# Patient Record
Sex: Female | Born: 2000 | Hispanic: No | Marital: Single | Smoking: Never smoker
Health system: Southern US, Community
[De-identification: ages and names within clinical notes are randomized; demographics above are authoritative.]

## PROBLEM LIST (undated history)

## (undated) DIAGNOSIS — R519 Headache, unspecified: Secondary | ICD-10-CM

## (undated) DIAGNOSIS — F329 Major depressive disorder, single episode, unspecified: Secondary | ICD-10-CM

## (undated) DIAGNOSIS — F32A Depression, unspecified: Secondary | ICD-10-CM

## (undated) DIAGNOSIS — R51 Headache: Secondary | ICD-10-CM

## (undated) DIAGNOSIS — F419 Anxiety disorder, unspecified: Secondary | ICD-10-CM

## (undated) DIAGNOSIS — F514 Sleep terrors [night terrors]: Secondary | ICD-10-CM

## (undated) HISTORY — PX: WISDOM TOOTH EXTRACTION: SHX21

## (undated) HISTORY — DX: Headache: R51

## (undated) HISTORY — DX: Headache, unspecified: R51.9

---
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
## 2020-10-05 ENCOUNTER — Other Ambulatory Visit: Payer: Self-pay

## 2020-10-05 ENCOUNTER — Encounter (HOSPITAL_COMMUNITY): Payer: Self-pay | Admitting: *Deleted

## 2020-10-05 ENCOUNTER — Emergency Department (HOSPITAL_COMMUNITY)
Admission: EM | Admit: 2020-10-05 | Discharge: 2020-10-06 | Disposition: A | Discharge status: Home / Self Care | Payer: Medicaid - Out of State | Attending: Emergency Medicine | Admitting: Emergency Medicine

## 2020-10-05 DIAGNOSIS — F1290 Cannabis use, unspecified, uncomplicated: Secondary | ICD-10-CM | POA: Insufficient documentation

## 2020-10-05 DIAGNOSIS — B9689 Other specified bacterial agents as the cause of diseases classified elsewhere: Secondary | ICD-10-CM | POA: Insufficient documentation

## 2020-10-05 DIAGNOSIS — O26891 Other specified pregnancy related conditions, first trimester: Secondary | ICD-10-CM | POA: Insufficient documentation

## 2020-10-05 DIAGNOSIS — R102 Pelvic and perineal pain: Secondary | ICD-10-CM | POA: Diagnosis not present

## 2020-10-05 DIAGNOSIS — O99321 Drug use complicating pregnancy, first trimester: Secondary | ICD-10-CM | POA: Diagnosis not present

## 2020-10-05 DIAGNOSIS — B3731 Acute candidiasis of vulva and vagina: Secondary | ICD-10-CM | POA: Insufficient documentation

## 2020-10-05 DIAGNOSIS — Z3A11 11 weeks gestation of pregnancy: Secondary | ICD-10-CM | POA: Diagnosis not present

## 2020-10-05 DIAGNOSIS — O98811 Other maternal infectious and parasitic diseases complicating pregnancy, first trimester: Secondary | ICD-10-CM | POA: Insufficient documentation

## 2020-10-05 DIAGNOSIS — Z880 Allergy status to penicillin: Secondary | ICD-10-CM | POA: Insufficient documentation

## 2020-10-05 DIAGNOSIS — N760 Acute vaginitis: Secondary | ICD-10-CM | POA: Diagnosis not present

## 2020-10-05 DIAGNOSIS — O99891 Other specified diseases and conditions complicating pregnancy: Secondary | ICD-10-CM | POA: Diagnosis not present

## 2020-10-05 NOTE — ED Notes (Signed)
POC urine pregnancy test was Positive

## 2020-10-05 NOTE — ED Triage Notes (Signed)
Pt c/o lower abdominal pain and back aches x 4 days. Pt is currently [redacted] weeks pregnant. Pt has been seeing an OB in [place], [place] and just moved here. She reports she has had a normal pregnancy up until now. Denies vaginal bleeding.

## 2020-10-05 NOTE — ED Provider Notes (Signed)
[facility] EMERGENCY DEPARTMENT Provider Note   CSN: [id number] Arrival date & time: 10/05/20  1759     History Chief Complaint  Patient presents with   Abdominal Pain    Danielle [name] is a 20 y.o. female.  HPI She complains of pelvic cramping and discharge for several days.  She is [redacted] weeks pregnant.  She had a pelvic ultrasound done, at 8 weeks pregnancy, 3 weeks ago that was normal by her report.  This was done in [place], prior to her moving here.  She has now relocated.  She denies fever, chills, nausea, vomiting, dysuria or urinary frequency.  This is her first pregnancy.  There are no other known active modifying factors    Past Medical History:  Diagnosis Date   Anxiety    Depression    Headache    Night terrors, childhood     Patient Active Problem List   Diagnosis Date Noted   Loss of weight 04/27/2017   Episodic tension type headache 03/03/2017   Anxiety 01/30/2017   Migraine without aura, not intractable, without status migrainosus 01/25/2017    Past Surgical History:  Procedure Laterality Date   WISDOM TOOTH EXTRACTION       OB History     Gravida  1   Para      Term      Preterm      AB      Living         SAB      IAB      Ectopic      Multiple      Live Births              No family history on file.  Social History   Tobacco Use   Smoking status: Never   Smokeless tobacco: Never  Vaping Use   Vaping Use: Never used  Substance Use Topics   Alcohol use: No   Drug use: No    Home Medications Prior to Admission medications   Medication Sig Start Date End Date Taking? Authorizing Provider  cloNIDine (CATAPRES) 0.1 MG tablet Take 0.1 mg by mouth at bedtime.    [provider]  escitalopram (LEXAPRO) 10 MG tablet Take 10 mg by mouth daily.    [provider]  etonogestrel (IMPLANON) 68 MG IMPL implant 1 each by Subdermal route once.    [provider]  fexofenadine (ALLEGRA ALLERGY) 180 MG  tablet Allegra Allergy 180 mg tablet  Take 1 tablet every day by oral route.    [provider]  fluticasone (FLONASE ALLERGY RELIEF) 50 MCG/ACT nasal spray Flonase Allergy Relief 50 mcg/actuation nasal spray,suspension  Spray 1 spray every day by intranasal route.    [provider]  ibuprofen (ADVIL,MOTRIN) 200 MG tablet Take 200 mg by mouth every 6 (six) hours as needed for moderate pain.    [provider]  mirtazapine (REMERON) 15 MG tablet Take 15 mg by mouth at bedtime.    [provider]  mirtazapine (REMERON) 30 MG tablet  04/12/17   [provider]  ondansetron (ZOFRAN-ODT) 4 MG disintegrating tablet ondansetron 4 mg disintegrating tablet  Place 1 tablet every 6 hours by translingual route as needed.    [provider]  SUMAtriptan (IMITREX) 25 MG tablet Take 1 tablet at onset of migraine along with Ibuprofen 400mg . May repeat in 2 hours if needed. 03/02/17   03/04/17, NP  tiZANidine (ZANAFLEX) 2 MG tablet Take 1 tablet  for severe pain. May repeat in 8 hours if needed. Patient taking differently: Take 2 mg by mouth every 8 (eight) hours as needed. Take 1 tablet for severe pain. May repeat in 8 hours if needed. 01/25/17   Elveria Rising, NP  Topiramate ER (TROKENDI XR) 25 MG CP24 Take 3 capsules at bedtime 04/27/17   Elveria Rising, NP  TRAZODONE HCL PO Take by mouth.    [provider]  Vitamin D-Vitamin K (VITAMIN K2-VITAMIN D3) 45-2000 MCG-UNIT CAPS vitamin D3 1,250 mcg (50,000 unit)-vitamin K2 200 mcg capsule  Take 1 capsule every week by oral route.    [provider]    Allergies    Penicillins and Prazosin  Review of Systems   Review of Systems  All other systems reviewed and are negative.  Physical Exam Updated Vital Signs BP (!) 90/55   Pulse 81   Temp 98.1 F (36.7 C) (Oral)   Resp 19   Ht 5\' 1"  (1.549 m)   Wt 49.9 kg   SpO2 100%   BMI 20.78 kg/m   Physical Exam Vitals and  nursing note reviewed.  Constitutional:      General: She is not in acute distress.    Appearance: She is well-developed. She is not ill-appearing or toxic-appearing.  HENT:     Head: Normocephalic and atraumatic.     Right Ear: External ear normal.     Left Ear: External ear normal.  Eyes:     Conjunctiva/sclera: Conjunctivae normal.     Pupils: Pupils are equal, round, and reactive to light.  Neck:     Trachea: Phonation normal.  Cardiovascular:     Rate and Rhythm: Normal rate.  Pulmonary:     Effort: Pulmonary effort is normal.  Abdominal:     General: There is no distension.     Palpations: Abdomen is soft.     Tenderness: There is no abdominal tenderness.  Musculoskeletal:        General: Normal range of motion.     Cervical back: Normal range of motion and neck supple.  Skin:    General: Skin is warm and dry.  Neurological:     Mental Status: She is alert and oriented to person, place, and time.     Cranial Nerves: No cranial nerve deficit.     Sensory: No sensory deficit.     Motor: No abnormal muscle tone.     Coordination: Coordination normal.  Psychiatric:        Mood and Affect: Mood normal.        Behavior: Behavior normal.        Thought Content: Thought content normal.        Judgment: Judgment normal.    ED Results / Procedures / Treatments   Labs (all labs ordered are listed, but only abnormal results are displayed) Labs Reviewed  URINALYSIS, ROUTINE W REFLEX MICROSCOPIC - Abnormal; Notable for the following components:      Result Value   APPearance CLOUDY (*)    Leukocytes,Ua LARGE (*)    Bacteria, UA RARE (*)    All other components within normal limits  WET PREP, GENITAL  POC URINE PREG, ED  GC/CHLAMYDIA PROBE AMP ([place]) NOT AT [facility]    EKG None  Radiology No results found.  Procedures Procedures   Medications Ordered in ED Medications - No data to display  ED Course  I have reviewed the triage vital signs and the nursing  notes.  Pertinent labs &  imaging results that were available during my care of the patient were reviewed by me and considered in my medical decision making (see chart for details).  Clinical Course as of 10/06/20 0134  Tue Oct 06, 2020  0024 The patient in conjunction with her mother, declined pelvic examination and blood testing.  She wanted [place] to try to loosen the baby's heartbeat which I did and was unable to obtain a fetal pulse with the Doppler.  This is not unusual at 11 weeks.  Patient and her mother understand that further testing cannot be done at this facility, at this time. [EW]    Clinical Course User Index [EW] Mancel Bale, MD   MDM Rules/Calculators/A&P                            Patient Vitals for the past 24 hrs:  BP Temp Temp src Pulse Resp SpO2 Height Weight  10/06/20 0100 (!) 90/55 -- -- 81 19 100 % -- --  10/06/20 0030 93/60 -- -- 71 19 100 % -- --  10/06/20 0000 99/62 -- -- 88 -- 99 % -- --  10/05/20 2330 93/63 -- -- 80 -- 100 % -- --  10/05/20 2300 93/60 -- -- 79 18 99 % -- --  10/05/20 2230 108/62 -- -- (!) 103 16 100 % -- --  10/05/20 2200 100/68 -- -- 87 18 100 % -- --  10/05/20 1811 -- -- -- -- -- -- 5\' 1"  (1.549 m) 49.9 kg  10/05/20 1809 115/65 98.1 F (36.7 C) Oral 98 17 99 % -- --    1:34 AM Reevaluation with update and discussion. After initial assessment and treatment, an updated evaluation reveals no change in clinical status, findings cussed with patient, mother, questions answered. 12/05/20   Medical Decision Making:  This patient is presenting for evaluation of cramping during pregnancy, which does require a range of treatment options, and is a complaint that involves a moderate risk of morbidity and mortality. The differential diagnoses include pelvic infection, pregnancy complication, obstetric care. I decided to review old records, and in summary 20 year old female presenting with symptoms during pregnancy, having had a prior  intrauterine pregnancy, 3 weeks ago.  She has not had current medical obstetric care.  I obtained additional historical information from mother at bedside.  Clinical Laboratory Tests Ordered, included Urinalysis and wet prep, GC, chlamydia, RPR, HIV . Review indicates urinalysis does not indicate infection.  Patient refused other testing    Critical Interventions-clinical evaluation, laboratory testing.  Attempted fetal heart tone assessment, unsuccessful.  Patient and mother decided not to get pelvic exam with testing to help determine source of infection.  Unable to proceed further with assessment, and no indication for transfer to higher level of care at this time.  Low suspicion for preterm pregnancy or acute pregnancy complication.  No indication for ectopic pregnancy.  Doubt sepsis or PID.  After These Interventions, the Patient was reevaluated and was found stable for discharge.  She does not have urine symptoms and urinalysis was not definitive for UTI therefore will follow expectantly.  She has access to care in the community, and plans to do that.  CRITICAL CARE-no Performed by: 26  Nursing Notes Reviewed/ Care Coordinated Applicable Imaging Reviewed Interpretation of Laboratory Data incorporated into ED treatment  The patient appears reasonably screened and/or stabilized for discharge and I doubt any other medical condition or other [facility] requiring further screening, evaluation,  or treatment in the ED at this time prior to discharge.  Plan: Home Medications-routine prenatal care medications; Home Treatments-pelvic rest, fluids, regular diet; return here if the recommended treatment, does not improve the symptoms; Recommended follow up-obstetrics as soon as possible and as needed.  Return here for problems.     Final Clinical Impression(s) / ED Diagnoses Final diagnoses:  [redacted] weeks gestation of pregnancy    Rx / DC Orders ED Discharge Orders     None         Mancel Bale, MD 10/06/20 [phone]

## 2020-10-06 ENCOUNTER — Other Ambulatory Visit: Payer: Self-pay

## 2020-10-06 ENCOUNTER — Inpatient Hospital Stay (EMERGENCY_DEPARTMENT_HOSPITAL)
Admission: AD | Admit: 2020-10-06 | Discharge: 2020-10-06 | Disposition: A | Discharge status: Home / Self Care | Payer: Medicaid - Out of State | Source: Home / Self Care | Attending: Obstetrics and Gynecology | Admitting: Obstetrics and Gynecology

## 2020-10-06 ENCOUNTER — Inpatient Hospital Stay (HOSPITAL_COMMUNITY): Payer: Medicaid - Out of State

## 2020-10-06 ENCOUNTER — Encounter (HOSPITAL_COMMUNITY): Payer: Self-pay | Admitting: Obstetrics and Gynecology

## 2020-10-06 DIAGNOSIS — B3731 Acute candidiasis of vulva and vagina: Secondary | ICD-10-CM

## 2020-10-06 DIAGNOSIS — Z3490 Encounter for supervision of normal pregnancy, unspecified, unspecified trimester: Secondary | ICD-10-CM

## 2020-10-06 DIAGNOSIS — F1290 Cannabis use, unspecified, uncomplicated: Secondary | ICD-10-CM | POA: Insufficient documentation

## 2020-10-06 DIAGNOSIS — Z3A11 11 weeks gestation of pregnancy: Secondary | ICD-10-CM | POA: Insufficient documentation

## 2020-10-06 DIAGNOSIS — O98811 Other maternal infectious and parasitic diseases complicating pregnancy, first trimester: Secondary | ICD-10-CM | POA: Insufficient documentation

## 2020-10-06 DIAGNOSIS — Z880 Allergy status to penicillin: Secondary | ICD-10-CM | POA: Insufficient documentation

## 2020-10-06 DIAGNOSIS — O99891 Other specified diseases and conditions complicating pregnancy: Secondary | ICD-10-CM

## 2020-10-06 DIAGNOSIS — N760 Acute vaginitis: Secondary | ICD-10-CM | POA: Diagnosis not present

## 2020-10-06 DIAGNOSIS — B9689 Other specified bacterial agents as the cause of diseases classified elsewhere: Secondary | ICD-10-CM

## 2020-10-06 DIAGNOSIS — O99321 Drug use complicating pregnancy, first trimester: Secondary | ICD-10-CM | POA: Insufficient documentation

## 2020-10-06 DIAGNOSIS — R109 Unspecified abdominal pain: Secondary | ICD-10-CM | POA: Insufficient documentation

## 2020-10-06 DIAGNOSIS — R102 Pelvic and perineal pain: Secondary | ICD-10-CM

## 2020-10-06 LAB — WET PREP, GENITAL
Sperm: NONE SEEN
Trich, Wet Prep: NONE SEEN

## 2020-10-06 LAB — URINALYSIS, ROUTINE W REFLEX MICROSCOPIC
Bacteria, UA: NONE SEEN
Bilirubin Urine: NEGATIVE
Bilirubin Urine: NEGATIVE
Glucose, UA: NEGATIVE mg/dL
Glucose, UA: NEGATIVE mg/dL
Hgb urine dipstick: NEGATIVE
Hgb urine dipstick: NEGATIVE
Ketones, ur: NEGATIVE mg/dL
Ketones, ur: NEGATIVE mg/dL
Nitrite: NEGATIVE
Nitrite: NEGATIVE
Protein, ur: NEGATIVE mg/dL
Protein, ur: NEGATIVE mg/dL
Specific Gravity, Urine: 1.019 (ref 1.005–1.030)
Specific Gravity, Urine: 1.021 (ref 1.005–1.030)
pH: 6 (ref 5.0–8.0)
pH: 6 (ref 5.0–8.0)

## 2020-10-06 MED ORDER — TERCONAZOLE 0.8 % VA CREA: 1.0000 | TOPICAL_CREAM | Freq: Every day | VAGINAL | 0 refills | Status: DC

## 2020-10-06 MED ORDER — METRONIDAZOLE 500 MG PO TABS: 500.0000 mg | ORAL_TABLET | Freq: Two times a day (BID) | ORAL | 0 refills | Status: DC

## 2020-10-06 NOTE — MAU Note (Addendum)
Presents with c/o cramping x4 days.  Reports cramping used to occur only in the morning and evening, now occurring all day.  Denies VB, but has vaginal discharge.  Reports discharge is thick, white/yellow and has no odor.  States she doesn't have any infections because she was tested @ [facility] last night. Received PNC in [place], none since moving to [place].

## 2020-10-06 NOTE — Discharge Instructions (Addendum)
The testing today did not indicate serious problems.  There was no evidence for urinary tract infection to complicate the pregnancy.  Follow-up with a obstetrician as soon as possible for further care and treatment.  Return to the emergency department of your choice as needed for problems.

## 2020-10-06 NOTE — Discharge Instructions (Signed)
Prenatal Care Providers           [facility]  [address] [phone]  [facility]   [address]  [phone]  [facility] [place]       [address] [phone]            [facility] [place]     [phone]          [facility] [place]   [address] [phone]  [facility]  [address] [phone]     [facility]  [address]   [phone]     [facility]  Phone: [phone]  [facility]  Phone: [phone]  [facility] Phone: [phone]  [facility] Phone: [phone]  [facility] Phone: [phone]  [facility] Phone: [phone]  [facility]  Phone: [phone] Safe Medications in Pregnancy   Acne: Benzoyl Peroxide Salicylic Acid  Backache/Headache: Tylenol: 2 regular strength every 4 hours OR              2 Extra strength every 6 hours  Colds/Coughs/Allergies: Benadryl (alcohol free) 25 mg every 6 hours as needed Breath right strips Claritin Cepacol throat lozenges Chloraseptic throat spray Cold-Eeze- up to three times per day Cough drops, alcohol free Flonase (by prescription only) Guaifenesin Mucinex Robitussin DM (plain only, alcohol free) Saline nasal spray/drops Sudafed (pseudoephedrine) & Actifed ** use only after [redacted] weeks gestation and if you do not have high blood pressure Tylenol Vicks Vaporub Zinc lozenges Zyrtec   Constipation: Colace Ducolax suppositories Fleet enema Glycerin suppositories Metamucil Milk of magnesia Miralax Senokot Smooth move tea  Diarrhea: Kaopectate Imodium A-D  *NO pepto  Bismol  Hemorrhoids: Anusol Anusol HC Preparation H Tucks  Indigestion: Tums Maalox Mylanta Zantac  Pepcid  Insomnia: Benadryl (alcohol free) 25mg every 6 hours as needed Tylenol PM Unisom, no Gelcaps  Leg Cramps: Tums MagGel  Nausea/Vomiting:  Bonine Dramamine Emetrol Ginger extract Sea bands Meclizine  Nausea medication to take during pregnancy:  Unisom (doxylamine succinate 25 mg tablets) Take one tablet daily at bedtime. If symptoms are not adequately controlled, the dose can be increased to a maximum recommended dose of two tablets daily (1/2 tablet in the morning, 1/2 tablet mid-afternoon and one at bedtime). Vitamin B6 100mg tablets. Take one tablet twice a day (up to 200 mg per day).  Skin Rashes: Aveeno products Benadryl cream or 25mg every 6 hours as needed Calamine Lotion 1% cortisone cream  Yeast infection: Gyne-lotrimin 7 Monistat 7   **If taking multiple medications, please check labels to avoid duplicating the same active ingredients **take medication as directed on the label ** Do not exceed 4000 mg of tylenol in 24 hours **Do not take medications that contain aspirin or ibuprofen    

## 2020-10-06 NOTE — ED Notes (Signed)
Pt states she does not feel like she needs any blood work or pelvic exam done, Dr. Effie Shy notified. Attempted to doppler FHTs by myself and Dr. Effie Shy, unable to do so due to [redacted]wk gestation.

## 2020-10-06 NOTE — MAU Provider Note (Signed)
History     CSN: [id number]  Arrival date and time: 10/06/20 1143   Event Date/Time   First Provider Initiated Contact with Patient 10/06/20 1217      Chief Complaint  Patient presents with   Cramping   HPI Danielle [name] is a 20 y.o. G1P0 at [redacted]w[redacted]d who presents to MAU with chief complaint of suprapubic cramping. This is a new problem, onset about five days ago. Pain score is 6/10. Pain does not radiate. She denies aggravating or alleviating factors. She has not taken medication or tried other treatments for this complaint. Patient is s/p evaluation for this complaint at [facility] ED last night. She states she was discharged home without a diagnosis.   Patient acknowledges THC use early in pregnancy. She denies use since returning to [place] from [place] last week.  OB History     Gravida  1   Para      Term      Preterm      AB      Living         SAB      IAB      Ectopic      Multiple      Live Births              Past Medical History:  Diagnosis Date   Anxiety    Depression    Headache    Night terrors, childhood     Past Surgical History:  Procedure Laterality Date   WISDOM TOOTH EXTRACTION      No family history on file.  Social History   Tobacco Use   Smoking status: Never   Smokeless tobacco: Never  Vaping Use   Vaping Use: Never used  Substance Use Topics   Alcohol use: No   Drug use: No    Allergies:  Allergies  Allergen Reactions   Penicillins Other (See Comments)    Hypotension    Prazosin Other (See Comments)    Hypotension    Medications Prior to Admission  Medication Sig Dispense Refill Last Dose   cloNIDine (CATAPRES) 0.1 MG tablet Take 0.1 mg by mouth at bedtime.      escitalopram (LEXAPRO) 10 MG tablet Take 10 mg by mouth daily.      etonogestrel (IMPLANON) 68 MG IMPL implant 1 each by Subdermal route once.      fexofenadine (ALLEGRA ALLERGY) 180 MG tablet Allegra Allergy 180 mg tablet  Take 1 tablet  every day by oral route.      fluticasone (FLONASE ALLERGY RELIEF) 50 MCG/ACT nasal spray Flonase Allergy Relief 50 mcg/actuation nasal spray,suspension  Spray 1 spray every day by intranasal route.      ibuprofen (ADVIL,MOTRIN) 200 MG tablet Take 200 mg by mouth every 6 (six) hours as needed for moderate pain.      mirtazapine (REMERON) 15 MG tablet Take 15 mg by mouth at bedtime.      mirtazapine (REMERON) 30 MG tablet   3    ondansetron (ZOFRAN-ODT) 4 MG disintegrating tablet ondansetron 4 mg disintegrating tablet  Place 1 tablet every 6 hours by translingual route as needed.      SUMAtriptan (IMITREX) 25 MG tablet Take 1 tablet at onset of migraine along with Ibuprofen 400mg . May repeat in 2 hours if needed. 9 tablet 0    tiZANidine (ZANAFLEX) 2 MG tablet Take 1 tablet for severe pain. May repeat in 8 hours if needed. (Patient taking differently: Take 2 mg by  mouth every 8 (eight) hours as needed. Take 1 tablet for severe pain. May repeat in 8 hours if needed.) 20 tablet 0    Topiramate ER (TROKENDI XR) 25 MG CP24 Take 3 capsules at bedtime 90 capsule 5    TRAZODONE HCL PO Take by mouth.      Vitamin D-Vitamin K (VITAMIN K2-VITAMIN D3) 45-2000 MCG-UNIT CAPS vitamin D3 1,250 mcg (50,000 unit)-vitamin K2 200 mcg capsule  Take 1 capsule every week by oral route.       Review of Systems  Gastrointestinal:  Positive for abdominal pain.  All other systems reviewed and are negative. Physical Exam   Blood pressure 101/61, pulse 75, temperature 98 F (36.7 C), temperature source Oral, resp. rate 18, height 5\' 1"  (1.549 m), weight 45.8 kg, SpO2 100 %.  Physical Exam Vitals and nursing note reviewed. Exam conducted with a chaperone present.  Constitutional:      Appearance: Normal appearance. She is not ill-appearing.  Cardiovascular:     Rate and Rhythm: Normal rate and regular rhythm.     Pulses: Normal pulses.     Heart sounds: Normal heart sounds.  Pulmonary:     Effort: Pulmonary  effort is normal.     Breath sounds: Normal breath sounds.  Abdominal:     General: Abdomen is flat.  Skin:    Capillary Refill: Capillary refill takes less than 2 seconds.  Neurological:     Mental Status: She is alert and oriented to person, place, and time.  Psychiatric:        Mood and Affect: Mood normal.        Behavior: Behavior normal.        Thought Content: Thought content normal.        Judgment: Judgment normal.    MAU Course  Procedures  --Pt agreeable to swabs and UA. Declines blood work and pelvic exam --S/p one prenatal visit in prior to moving to [place]. Records visible in Care Everywhere. THC +, ultrasound not visible. THC abstinence advised. Preemptive teaching about Cannabis Hyperemesis, return to MAU in event of acute onset food intolerance.  Orders Placed This Encounter  Procedures   Wet prep, genital   Culture, OB Urine   [place] OB Comp Less 14 Wks   Urinalysis, Routine w reflex microscopic Urine, Clean Catch   Nursing communication   Discharge patient   Patient Vitals for the past 24 hrs:  BP Temp Temp src Pulse Resp SpO2 Height Weight  10/06/20 1353 90/61 99 F (37.2 C) Oral -- 20 100 % -- --  10/06/20 1204 101/61 98 F (36.7 C) Oral 75 18 100 % -- --  10/06/20 1156 -- -- -- -- -- -- 5\' 1"  (1.549 m) 45.8 kg   Results for orders placed or performed during the hospital encounter of 10/06/20 (from the past 24 hour(s))  Wet prep, genital     Status: Abnormal   Collection Time: 10/06/20 12:32 PM   Specimen: PATH Cytology Cervicovaginal Ancillary Only  Result Value Ref Range   Yeast Wet Prep HPF POC PRESENT (A) NONE SEEN   Trich, Wet Prep NONE SEEN NONE SEEN   Clue Cells Wet Prep HPF POC PRESENT (A) NONE SEEN   WBC, Wet Prep HPF POC MANY (A) NONE SEEN   Sperm NONE SEEN   Urinalysis, Routine w reflex microscopic Urine, Clean Catch     Status: Abnormal   Collection Time: 10/06/20 12:47 PM  Result Value Ref Range   Color, Urine  YELLOW YELLOW    APPearance HAZY (A) CLEAR   Specific Gravity, Urine 1.021 1.005 - 1.030   pH 6.0 5.0 - 8.0   Glucose, UA NEGATIVE NEGATIVE mg/dL   Hgb urine dipstick NEGATIVE NEGATIVE   Bilirubin Urine NEGATIVE NEGATIVE   Ketones, ur NEGATIVE NEGATIVE mg/dL   Protein, ur NEGATIVE NEGATIVE mg/dL   Nitrite NEGATIVE NEGATIVE   Leukocytes,Ua TRACE (A) NEGATIVE   RBC / HPF 6-10 0 - 5 RBC/hpf   WBC, UA 0-5 0 - 5 WBC/hpf   Bacteria, UA NONE SEEN NONE SEEN   Squamous Epithelial / LPF 0-5 0 - 5   Mucus PRESENT    Amorphous Crystal PRESENT    Meds ordered this encounter  Medications   terconazole (TERAZOL 3) 0.8 % vaginal cream    Sig: Place 1 applicator vaginally at bedtime. Apply nightly for three nights.    Dispense:  20 g    Refill:  0    Order Specific Question:   Supervising Provider    Answer:   [place] Bing [[id number]]   metroNIDAZOLE (FLAGYL) 500 MG tablet    Sig: Take 1 tablet (500 mg total) by mouth 2 (two) times daily.    Dispense:  14 tablet    Refill:  0    Order Specific Question:   Supervising Provider    Answer:   [place] Bing [[id number]]   US OB Comp Less 14 Wks  Result Date: 10/06/2020 CLINICAL DATA:  Suprapubic pain for 4 days in first trimester of pregnancy; LMP 07/08/2020 EXAM: OBSTETRIC <14 WK ULTRASOUND TECHNIQUE: Transabdominal ultrasound was performed for evaluation of the gestation as well as the maternal uterus and adnexal regions. COMPARISON:  None FINDINGS: Intrauterine gestational sac: Present, single Yolk sac:  Not identified Embryo:  Present Cardiac Activity: Present Heart Rate: 160 bpm CRL:   48.8 mm   11 w 4 d                  [place] EDC: 04/23/2021 Subchorionic hemorrhage:  None visualized. Maternal uterus/adnexae: Maternal uterus otherwise normal. RIGHT ovary normal size and morphology 1.8 x 3.6 x 1.7 cm. LEFT ovary normal size and morphology, 2.5 x 1.9 x 1.6 cm. No free pelvic fluid or adnexal masses. IMPRESSION: Single live intrauterine gestation at 11 weeks 4 days  EGA by crown-rump length. No acute abnormalities. Electronically Signed   By: Ulyses Southward M.D.   On: 10/06/2020 13:13     Assessment and Plan  --20 y.o. G1P0 with live IUP at [redacted]w[redacted]d  --Vulvovaginal Candidiasis --Bacterial Vaginosis --THC + per prenatal records --Discharge home in stable condition  F/U: --Patient to select prenatal care provider, schedule care. Given list of OBs with privileges at [facility]  Calvert Cantor, CNM 10/06/2020, 6:47 PM

## 2020-10-07 LAB — CULTURE, OB URINE

## 2020-10-07 LAB — GC/CHLAMYDIA PROBE AMP (~~LOC~~) NOT AT ARMC
Chlamydia: NEGATIVE
Comment: NEGATIVE
Comment: NORMAL
Neisseria Gonorrhea: NEGATIVE

## 2020-10-07 LAB — POC URINE PREG, ED: Preg Test, Ur: POSITIVE — AB

## 2020-10-14 ENCOUNTER — Other Ambulatory Visit: Payer: Self-pay

## 2020-10-14 ENCOUNTER — Encounter (HOSPITAL_COMMUNITY): Payer: Self-pay | Admitting: Obstetrics and Gynecology

## 2020-10-14 ENCOUNTER — Inpatient Hospital Stay (HOSPITAL_COMMUNITY)
Admission: AD | Admit: 2020-10-14 | Discharge: 2020-10-14 | Disposition: A | Discharge status: Home / Self Care | Payer: Medicaid - Out of State | Attending: Obstetrics and Gynecology | Admitting: Obstetrics and Gynecology

## 2020-10-14 DIAGNOSIS — B379 Candidiasis, unspecified: Secondary | ICD-10-CM

## 2020-10-14 DIAGNOSIS — Z880 Allergy status to penicillin: Secondary | ICD-10-CM | POA: Insufficient documentation

## 2020-10-14 DIAGNOSIS — B3731 Acute candidiasis of vulva and vagina: Secondary | ICD-10-CM | POA: Insufficient documentation

## 2020-10-14 DIAGNOSIS — R109 Unspecified abdominal pain: Secondary | ICD-10-CM | POA: Insufficient documentation

## 2020-10-14 DIAGNOSIS — Z3A12 12 weeks gestation of pregnancy: Secondary | ICD-10-CM | POA: Insufficient documentation

## 2020-10-14 DIAGNOSIS — O98811 Other maternal infectious and parasitic diseases complicating pregnancy, first trimester: Secondary | ICD-10-CM | POA: Insufficient documentation

## 2020-10-14 LAB — WET PREP, GENITAL
Clue Cells Wet Prep HPF POC: NONE SEEN
Sperm: NONE SEEN
Trich, Wet Prep: NONE SEEN
Yeast Wet Prep HPF POC: NONE SEEN

## 2020-10-14 LAB — URINALYSIS, ROUTINE W REFLEX MICROSCOPIC
Bilirubin Urine: NEGATIVE
Glucose, UA: NEGATIVE mg/dL
Hgb urine dipstick: NEGATIVE
Ketones, ur: 5 mg/dL — AB
Leukocytes,Ua: NEGATIVE
Nitrite: NEGATIVE
Protein, ur: NEGATIVE mg/dL
Specific Gravity, Urine: 1.024 (ref 1.005–1.030)
pH: 5 (ref 5.0–8.0)

## 2020-10-14 MED ORDER — CYCLOBENZAPRINE HCL 5 MG PO TABS
10.0000 mg | ORAL_TABLET | Freq: Once | ORAL | Status: AC
  Administered 2020-10-14: 10 mg via ORAL
  Filled 2020-10-14: qty 2

## 2020-10-14 MED ORDER — TERCONAZOLE 0.8 % VA CREA: 1.0000 | TOPICAL_CREAM | Freq: Every day | VAGINAL | 2 refills | Status: AC

## 2020-10-14 MED ORDER — CYCLOBENZAPRINE HCL 10 MG PO TABS: 10.0000 mg | ORAL_TABLET | Freq: Three times a day (TID) | ORAL | 0 refills | Status: DC | PRN

## 2020-10-14 MED ORDER — LACTATED RINGERS IV BOLUS
1000.0000 mL | Freq: Once | INTRAVENOUS | Status: AC
  Administered 2020-10-14: 1000 mL via INTRAVENOUS

## 2020-10-14 MED ORDER — PRENATAL 27-0.8 MG PO TABS: 1.0000 | ORAL_TABLET | Freq: Every day | ORAL | 0 refills | Status: DC

## 2020-10-14 NOTE — MAU Provider Note (Addendum)
History     CSN: [id number]  Arrival date and time: 10/14/20 1644   Event Date/Time   First Provider Initiated Contact with Patient 10/14/20 1738      Chief Complaint  Patient presents with   Abdominal Pain   Vaginal Bleeding   Depression   20 y/o G1P0 @[redacted]w[redacted]d  presenting with 9/10 cramping, suprapubic and back pain x 1 week that is not relieved with tylenol. She also lists concerns for thick pink/gray clotted discharge x 2 days. 3 days prior she finished her antifungal treatment for yeast. She notes burning and increased urinary frequency. Endorses nausea/vomiting x 3 days during most of the day and she has been unable to keep much food and her antinausea medication down. She regularly takes her prenatal vitamin. Denies any smoking or illicit drug use. Denies fever or leakage of fluid.  Abdominal Pain Associated symptoms include frequency, nausea and vomiting. Pertinent negatives include no constipation, diarrhea or hematuria.  Vaginal Bleeding The patient's primary symptoms include vaginal discharge. Associated symptoms include abdominal pain, frequency, nausea and vomiting. Pertinent negatives include no constipation, diarrhea or hematuria.  Depression        OB History     Gravida  1   Para      Term      Preterm      AB      Living         SAB      IAB      Ectopic      Multiple      Live Births              Past Medical History:  Diagnosis Date   Anxiety    Depression    Headache    Night terrors, childhood     Past Surgical History:  Procedure Laterality Date   WISDOM TOOTH EXTRACTION      History reviewed. No pertinent family history.  Social History   Tobacco Use   Smoking status: Never   Smokeless tobacco: Never  Vaping Use   Vaping Use: Never used  Substance Use Topics   Alcohol use: No   Drug use: No    Allergies:  Allergies  Allergen Reactions   Penicillins Other (See Comments)    Hypotension    Prazosin Other (See  Comments)    Hypotension    Medications Prior to Admission  Medication Sig Dispense Refill Last Dose   metroNIDAZOLE (FLAGYL) 500 MG tablet Take 1 tablet (500 mg total) by mouth 2 (two) times daily. 14 tablet 0 Past Week   terconazole (TERAZOL 3) 0.8 % vaginal cream Place 1 applicator vaginally at bedtime. Apply nightly for three nights. 20 g 0 Past Week    Review of Systems  Gastrointestinal:  Positive for abdominal pain, nausea and vomiting. Negative for blood in stool, constipation and diarrhea.  Genitourinary:  Positive for frequency, vaginal bleeding and vaginal discharge. Negative for hematuria.  Psychiatric/Behavioral:  Positive for depression.   Physical Exam   Blood pressure 94/60, pulse 88, temperature 98.1 F (36.7 C), temperature source Oral, resp. rate 16, height 5\' 1"  (1.549 m), weight 46 kg, SpO2 100 %.  Physical Exam Constitutional:      General: She is not in acute distress.    Appearance: She is well-developed.  Cardiovascular:     Rate and Rhythm: Normal rate and regular rhythm.     Heart sounds: Normal heart sounds.  Pulmonary:     Effort: Pulmonary effort is normal.  No respiratory distress.     Breath sounds: Normal breath sounds.  Abdominal:     General: Bowel sounds are normal.     Tenderness: There is abdominal tenderness in the suprapubic area. There is no guarding.  Genitourinary:    Vagina: Tenderness present.     Cervix: Discharge present.     Uterus: Normal.      Comments: Prominent white discharge in vaginal canal and cervical os Neurological:     Mental Status: She is alert.    MAU Course  Procedures  MDM LR 1L bolus Flexeril 10mg  Assessment and Plan  Vulvovaginal candidiasis Wet prep negative but given cervical exam, prescribed Terconazole x 3 days. Flexeril 10mg  TID prn for cramping. Discussed healthy, nutritious food options as compared to lunchables for nutrients in addition to hydration habits. Pt is waiting for medicaid to be  approved in November before arranging initial OB visit. Discharged home in stable condition.   , DO 10/14/2020, 6:47 PM PGY-1, [facility]  Attestation of Supervision of Resident:  I confirm that I have verified the information documented in the  resident's  note and that I have also personally reperformed the history, physical exam and all medical decision making activities.  I have verified that all services and findings are accurately documented in this student's note; and I agree with management and plan as outlined in the documentation. I have also made any necessary editorial changes.  Shelby Mattocks DNP, CNM  10/14/20  9:42 PM

## 2020-10-14 NOTE — MAU Note (Signed)
Danielle [name] is a 20 y.o. at [redacted]w[redacted]d here in MAU reporting: has been having cramping for the past few days. Was having some bleeding, states it is spotting today. Also has not been feeling herself and is feeling sad and depressed, hx of anxiety. Denies any suicidal thoughts.  Onset of complaint: ongoing  Pain score: 9/10  Vitals:   10/14/20 1713  BP: 94/60  Pulse: 88  Resp: 16  Temp: 98.1 F (36.7 C)  SpO2: 100%     FHT:155  Lab orders placed from triage: UA

## 2020-11-09 ENCOUNTER — Telehealth: Payer: Self-pay | Admitting: Advanced Practice Midwife

## 2020-11-09 NOTE — Telephone Encounter (Signed)
Returned pt's call. Two identifiers used. Pt was instructed to use a sports bra one to two sizes smaller than what she normally wears and to use cold cabbage leaves. Pt stated that she had been pumping her breasts because of the pain and hardness. She was told to stop pumping immediately, take Tylenol for pain, and use compression to stop the production. Avoid hot showers as well. Pt confirmed understanding.

## 2020-11-09 NOTE — Telephone Encounter (Signed)
Patient had a miscarriage at 16 weeks. She called to cancel her scheduled appointments. She stated that she was still leaking milk and wanted to know what she could do to get it to stop.

## 2020-11-19 ENCOUNTER — Ambulatory Visit: Payer: Medicaid - Out of State | Admitting: *Deleted

## 2020-11-19 ENCOUNTER — Encounter: Payer: Medicaid - Out of State | Admitting: Advanced Practice Midwife

## 2020-12-10 ENCOUNTER — Other Ambulatory Visit: Payer: Self-pay

## 2020-12-10 ENCOUNTER — Ambulatory Visit
Admission: EM | Admit: 2020-12-10 | Discharge: 2020-12-10 | Disposition: A | Discharge status: Home / Self Care | Payer: BC Managed Care – PPO | Attending: Emergency Medicine | Admitting: Emergency Medicine

## 2020-12-10 ENCOUNTER — Encounter: Payer: Self-pay | Admitting: Emergency Medicine

## 2020-12-10 DIAGNOSIS — J111 Influenza due to unidentified influenza virus with other respiratory manifestations: Secondary | ICD-10-CM

## 2020-12-10 DIAGNOSIS — J029 Acute pharyngitis, unspecified: Secondary | ICD-10-CM

## 2020-12-10 LAB — POCT RAPID STREP A (OFFICE): Rapid Strep A Screen: NEGATIVE

## 2020-12-10 MED ORDER — PROMETHAZINE-DM 6.25-15 MG/5ML PO SYRP: 5.0000 mL | ORAL_SOLUTION | Freq: Four times a day (QID) | ORAL | 0 refills | Status: AC | PRN

## 2020-12-10 MED ORDER — FLUTICASONE PROPIONATE 50 MCG/ACT NA SUSP: 2.0000 | Freq: Every day | NASAL | 0 refills | Status: AC

## 2020-12-10 NOTE — ED Triage Notes (Signed)
Started feeling bad 4 days ago with coughing.  Coughing has worsened, hot flashes, headaches, body aches, cough has worsened.

## 2020-12-10 NOTE — Discharge Instructions (Addendum)
your rapid strep was negative today, so we have sent off a throat culture.  We will contact you and call in the appropriate antibiotics if your culture comes back positive for an infection requiring antibiotic treatment.  Give [place] a working phone number.   500 mg of Tylenol and 400 mg ibuprofen together 3-4 times a day as needed for pain.  Make sure you drink plenty of extra fluids.  Some people find salt water gargles and  Traditional Medicinal's "Throat Coat" tea helpful. Take 5 mL of liquid Benadryl and 5 mL of Maalox. Mix it together, and then hold it in your mouth for as long as you can and then swallow. You may do this 4 times a day.    Flonase, saline nasal irrigation with a NeilMed sinus rinse and distilled water as often as you want, Mucinex D for nasal congestion.  Below is a list of primary care practices who are taking new patients for you to follow-up with.  [facility] -multiple locations.  See website at [email or web address]  [facility] internal medicine clinic Ground Floor - [facility], [address] [place], [place], [place] [address] [phone]  [facility] at [facility] [address] [place], [place] [address] [phone]  [facility] [address] [place], [place] [address] [phone]  [facility] [phone] [address] [place] [address]  [facility]: [address] [place] [address]  [phone]  [facility]: [address] [place] [address]  [phone]  [place] [facility] : [address] [place] [address] [phone]  [facility] Primary Care: [address] [place] [address] [phone]  [facility]: [address] [place] [address] [phone]  Dr. Oneal Grout [address] [facility] [place] [address]  [phone]  Go to www.goodrx.com  or www.costplusdrugs.com to look up your medications. This will give you a list of where you can find your prescriptions at the most affordable prices. Or ask the pharmacist what the cash price is, or if they have any other discount programs available to help make your medication more affordable. This can be less expensive than what you would pay with insurance.

## 2020-12-10 NOTE — ED Provider Notes (Signed)
HPI  SUBJECTIVE:  Danielle [name] is a 20 y.o. female who presents with sore throat, body aches, chills, headaches, nasal congestion, rhinorrhea, sore throat, postnasal drip, cough, nausea, diarrhea starting 4 days ago.  No fevers above 100.4, loss of sense of smell or taste, wheezing, shortness of breath, vomiting, abdominal pain.  No drooling, trismus, neck stiffness, voice changes, sensation of throat swelling shut.  No known COVID or flu exposure.  She did not get the COVID or flu vaccines.  She had a negative home COVID test yesterday.  No antipyretic in the past 6 hours.  States that she is unable to sleep at night secondary to the cough.  She has been doing cough drops, tea and honey, rest without improvement in her symptoms.  No aggravating factors.  She has no past medical history.  LMP: Now.  Denies the possibility being pregnant.  PMD: None.   Past Medical History:  Diagnosis Date   Anxiety    Depression    Headache    Night terrors, childhood     Past Surgical History:  Procedure Laterality Date   WISDOM TOOTH EXTRACTION      Family History  Problem Relation Age of Onset   Seizures Mother    Healthy Father     Social History   Tobacco Use   Smoking status: Never   Smokeless tobacco: Never  Vaping Use   Vaping Use: Some days  Substance Use Topics   Alcohol use: No   Drug use: No    No current facility-administered medications for this encounter.  Current Outpatient Medications:    fluticasone (FLONASE) 50 MCG/ACT nasal spray, Place 2 sprays into both nostrils daily., Disp: 16 g, Rfl: 0   promethazine-dextromethorphan (PROMETHAZINE-DM) 6.25-15 MG/5ML syrup, Take 5 mLs by mouth 4 (four) times daily as needed for cough., Disp: 118 mL, Rfl: 0  Allergies  Allergen Reactions   Penicillins Other (See Comments)    Hypotension    Prazosin Other (See Comments)    Hypotension     ROS  As noted in HPI.   Physical Exam  BP 103/73 (BP Location: Right Arm)    Pulse 84   Temp 100.1 F (37.8 C) (Oral)   Resp 18   LMP 12/10/2020   SpO2 100%   Breastfeeding Unknown    Constitutional: Well developed, well nourished, no acute distress Eyes:  EOMI, conjunctiva normal bilaterally HENT: Normocephalic, atraumatic,mucus membranes moist.  Positive nasal congestion, worse on the left.  Erythematous, swollen turbinates.  No maxillary, frontal sinus tenderness.  Erythematous oropharynx, enlarged tonsils without exudates.  Uvula midline.  No obvious postnasal drip.   Neck: No cervical lymphadenopathy  respiratory: Normal inspiratory effort, lungs clear bilaterally Cardiovascular: Normal rate, regular rhythm, no murmurs GI: nondistended soft, nontender, no splenomegaly. skin: No rash, skin intact Musculoskeletal: no deformities Neurologic: Alert & oriented x 3, no focal neuro deficits Psychiatric: Speech and behavior appropriate   ED Course   Medications - No data to display  Orders Placed This Encounter  Procedures   Culture, group A strep    Standing Status:   Standing    Number of Occurrences:   1   POCT rapid strep A    Standing Status:   Standing    Number of Occurrences:   1    Results for orders placed or performed during the hospital encounter of 12/10/20 (from the past 24 hour(s))  POCT rapid strep A     Status: None   Collection Time:  12/10/20  2:39 PM  Result Value Ref Range   Rapid Strep A Screen Negative Negative   No results found.  ED Clinical Impression  1. Sore throat   2. Influenza-like illness      ED Assessment/Plan  We discussed doing COVID and flu testing, however, patient is out of the window for treatment for influenza and will be out of the window for treatment for COVID by the time that the lab is resulted.  She has no other medical comorbidities other than being unvaccinated, so we decided to not do testing.  Checking a strep.    Rapid strep negative.  Throat culture sent.  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  Home with Flonase, Mucinex D, saline nasal irrigation, Tylenol 500/400 mg ibuprofen, 3-4 times a day, Benadryl/Maalox mixture.  Will provide primary care list and order assistance in finding a PMD.   Discussed labs,  MDM, treatment plan, and plan for follow-up with patient. patient agrees with plan.   Meds ordered this encounter  Medications   fluticasone (FLONASE) 50 MCG/ACT nasal spray    Sig: Place 2 sprays into both nostrils daily.    Dispense:  16 g    Refill:  0   promethazine-dextromethorphan (PROMETHAZINE-DM) 6.25-15 MG/5ML syrup    Sig: Take 5 mLs by mouth 4 (four) times daily as needed for cough.    Dispense:  118 mL    Refill:  0      *This clinic note was created using Scientist, clinical (histocompatibility and immunogenetics). Therefore, there may be occasional mistakes despite careful proofreading.  ?    Domenick Gong, MD 12/11/20 [phone]

## 2020-12-11 ENCOUNTER — Encounter (HOSPITAL_COMMUNITY): Payer: Self-pay | Admitting: Emergency Medicine

## 2020-12-11 ENCOUNTER — Other Ambulatory Visit: Payer: Self-pay

## 2020-12-11 ENCOUNTER — Emergency Department (HOSPITAL_COMMUNITY)
Admission: EM | Admit: 2020-12-11 | Discharge: 2020-12-11 | Disposition: A | Discharge status: Home / Self Care | Payer: BC Managed Care – PPO | Attending: Emergency Medicine | Admitting: Emergency Medicine

## 2020-12-11 DIAGNOSIS — Z20822 Contact with and (suspected) exposure to covid-19: Secondary | ICD-10-CM | POA: Diagnosis not present

## 2020-12-11 DIAGNOSIS — J101 Influenza due to other identified influenza virus with other respiratory manifestations: Secondary | ICD-10-CM | POA: Insufficient documentation

## 2020-12-11 DIAGNOSIS — Z28310 Unvaccinated for covid-19: Secondary | ICD-10-CM | POA: Insufficient documentation

## 2020-12-11 DIAGNOSIS — R059 Cough, unspecified: Secondary | ICD-10-CM | POA: Diagnosis present

## 2020-12-11 DIAGNOSIS — R000 Tachycardia, unspecified: Secondary | ICD-10-CM | POA: Insufficient documentation

## 2020-12-11 LAB — RESP PANEL BY RT-PCR (FLU A&B, COVID) ARPGX2
Influenza A by PCR: POSITIVE — AB
Influenza B by PCR: NEGATIVE
SARS Coronavirus 2 by RT PCR: NEGATIVE

## 2020-12-11 MED ORDER — ACETAMINOPHEN 500 MG PO TABS
1000.0000 mg | ORAL_TABLET | Freq: Once | ORAL | Status: AC
  Administered 2020-12-11: 1000 mg via ORAL
  Filled 2020-12-11: qty 2

## 2020-12-11 NOTE — ED Triage Notes (Signed)
Pt c/o cough/congestion/fever/fatigue x 3 days.

## 2020-12-11 NOTE — Discharge Instructions (Addendum)
You have been diagnosed with influenza A.  Please continue taking medication previously prescribed.  Get plenty of rest, drink plenty of fluid.  I hope you feel better soon

## 2020-12-11 NOTE — ED Provider Notes (Signed)
[facility] EMERGENCY DEPARTMENT Provider Note   CSN: [id number] Arrival date & time: 12/11/20  1336     History Chief Complaint  Patient presents with   Cough    Danielle [name] is a 20 y.o. female.  The history is provided by the patient and medical records. No language interpreter was used.  Cough  20 year old female presenting with cold symptoms.  For the past 5 days patient has had fever, chills, headache, body aches, sore throat, congestion, cough, nausea, diarrhea.  Symptoms moderate in severity and has been persistent.  Patient was seen at urgent care center for symptoms yesterday.  She was given symptomatic treatment but returns today because of symptoms still persist.  She mention she has had several negative home COVID test.  She has not been vaccinated for COVID and flu in the past.  She denies any significant shortness of breath.  No other complaint.  Past Medical History:  Diagnosis Date   Anxiety    Depression    Headache    Night terrors, childhood     Patient Active Problem List   Diagnosis Date Noted   Loss of weight 04/27/2017   Episodic tension type headache 03/03/2017   Anxiety 01/30/2017   Migraine without aura, not intractable, without status migrainosus 01/25/2017    Past Surgical History:  Procedure Laterality Date   WISDOM TOOTH EXTRACTION       OB History     Gravida  1   Para      Term      Preterm      AB      Living         SAB      IAB      Ectopic      Multiple      Live Births              Family History  Problem Relation Age of Onset   Seizures Mother    Healthy Father     Social History   Tobacco Use   Smoking status: Never   Smokeless tobacco: Never  Vaping Use   Vaping Use: Some days  Substance Use Topics   Alcohol use: No   Drug use: No    Home Medications Prior to Admission medications   Medication Sig Start Date End Date Taking? Authorizing Provider  fluticasone (FLONASE) 50 MCG/ACT  nasal spray Place 2 sprays into both nostrils daily. 12/10/20   Domenick Gong, MD  promethazine-dextromethorphan (PROMETHAZINE-DM) 6.25-15 MG/5ML syrup Take 5 mLs by mouth 4 (four) times daily as needed for cough. 12/10/20   Domenick Gong, MD    Allergies    Penicillins and Prazosin  Review of Systems   Review of Systems  Respiratory:  Positive for cough.   All other systems reviewed and are negative.  Physical Exam Updated Vital Signs BP 99/71   Pulse (!) 113   Temp (!) 100.7 F (38.2 C)   Resp 20   Ht 5\' 1"  (1.549 m)   Wt 49.9 kg   LMP 12/10/2020   SpO2 100%   BMI 20.78 kg/m   Physical Exam Vitals and nursing note reviewed.  Constitutional:      General: She is not in acute distress.    Appearance: She is well-developed.  HENT:     Head: Atraumatic.     Nose: Nose normal.     Mouth/Throat:     Mouth: Mucous membranes are moist.  Eyes:  Conjunctiva/sclera: Conjunctivae normal.  Cardiovascular:     Rate and Rhythm: Tachycardia present.     Pulses: Normal pulses.     Heart sounds: Normal heart sounds.  Pulmonary:     Effort: Pulmonary effort is normal.     Breath sounds: No wheezing, rhonchi or rales.  Abdominal:     Palpations: Abdomen is soft.     Tenderness: There is no abdominal tenderness.  Musculoskeletal:     Cervical back: Neck supple.  Skin:    Findings: No rash.  Neurological:     Mental Status: She is alert. Mental status is at baseline.  Psychiatric:        Mood and Affect: Mood normal.    ED Results / Procedures / Treatments   Labs (all labs ordered are listed, but only abnormal results are displayed) Labs Reviewed  RESP PANEL BY RT-PCR (FLU A&B, COVID) ARPGX2 - Abnormal; Notable for the following components:      Result Value   Influenza A by PCR POSITIVE (*)    All other components within normal limits    EKG None  Radiology No results found.  Procedures Procedures   Medications Ordered in ED Medications - No data to  display  ED Course  I have reviewed the triage vital signs and the nursing notes.  Pertinent labs & imaging results that were available during my care of the patient were reviewed by me and considered in my medical decision making (see chart for details).    MDM Rules/Calculators/A&P                           BP 103/70   Pulse 96   Temp 100.2 F (37.9 C) (Oral)   Resp 18   Ht 5\' 1"  (1.549 m)   Wt 49.9 kg   LMP 12/10/2020   SpO2 100%   BMI 20.78 kg/m   Final Clinical Impression(s) / ED Diagnoses Final diagnoses:  Influenza A    Rx / DC Orders ED Discharge Orders     None      Patient here with flulike symptoms.  She did test positive for influenza A.  She is outside the window to be treated with Tamiflu.  Patient however overall well-appearing lungs clear on auscultation.  She does have a temperature of 100.7 and tachycardic, Tylenol given.  Otherwise she is stable for discharge home with symptom control.  Work note provided as well.  Danielle [name] was evaluated in Emergency Department on 12/11/2020 for the symptoms described in the history of present illness. She was evaluated in the context of the global COVID-19 pandemic, which necessitated consideration that the patient might be at risk for infection with the SARS-CoV-2 virus that causes COVID-19. Institutional protocols and algorithms that pertain to the evaluation of patients at risk for COVID-19 are in a state of rapid change based on information released by regulatory bodies including the CDC and federal and state organizations. These policies and algorithms were followed during the patient's care in the ED.    14/09/2020, PA-C 12/11/20 1726    14/09/22, MD 12/12/20 [phone]

## 2020-12-13 LAB — CULTURE, GROUP A STREP (THRC)

## 2021-03-30 ENCOUNTER — Ambulatory Visit: Payer: BC Managed Care – PPO | Admitting: Internal Medicine

## 2023-02-07 IMAGING — US US OB COMP LESS 14 WK
1 series · 15 of 28 positions shown · non-contrast
Comparison: None

CLINICAL DATA: Suprapubic pain for 4 days in first trimester of
pregnancy; LMP 07/08/2020

EXAM:
OBSTETRIC <14 WK ULTRASOUND
TECHNIQUE: Transabdominal ultrasound was performed for evaluation of the
gestation as well as the maternal uterus and adnexal regions.

[Series 1: us ob comp less 14 wk · 15 of 32 slices shown]
[im 1/32]
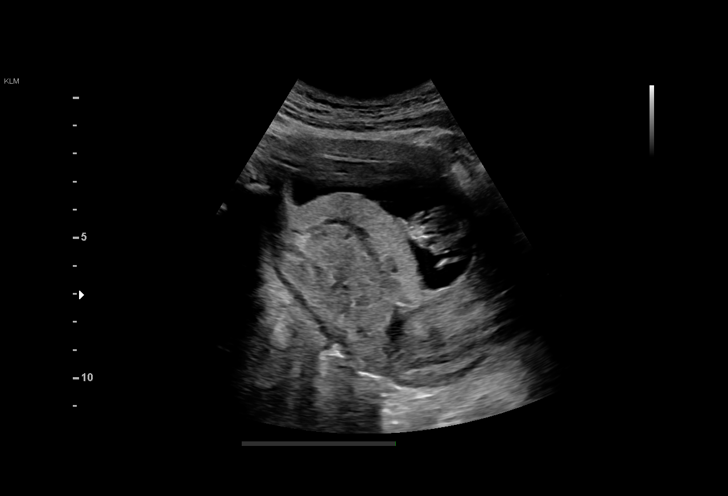
[im 3/32]
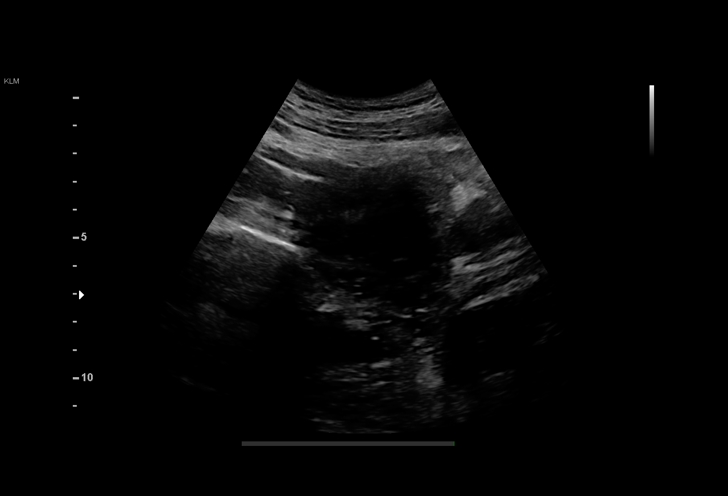
[im 5/32]
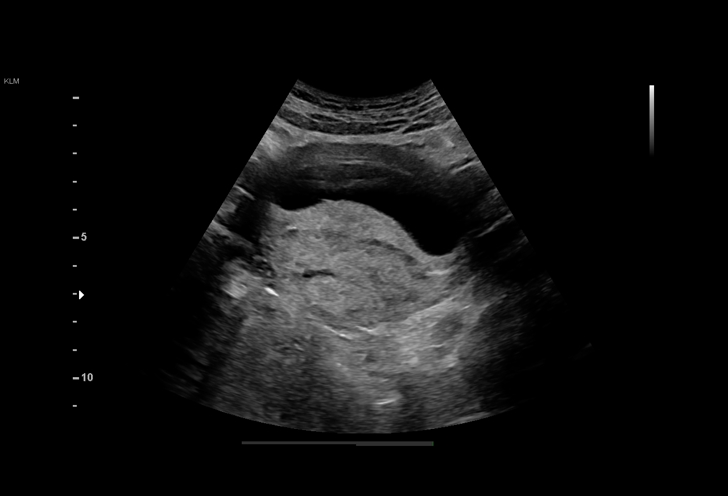
[im 7/32]
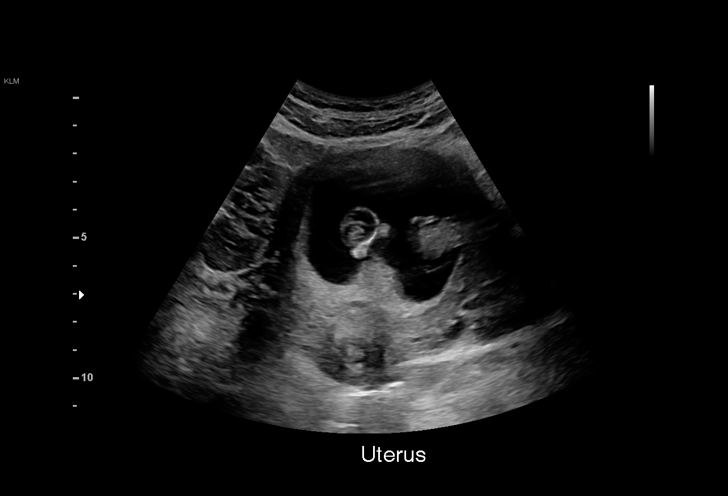
[im 10/32]
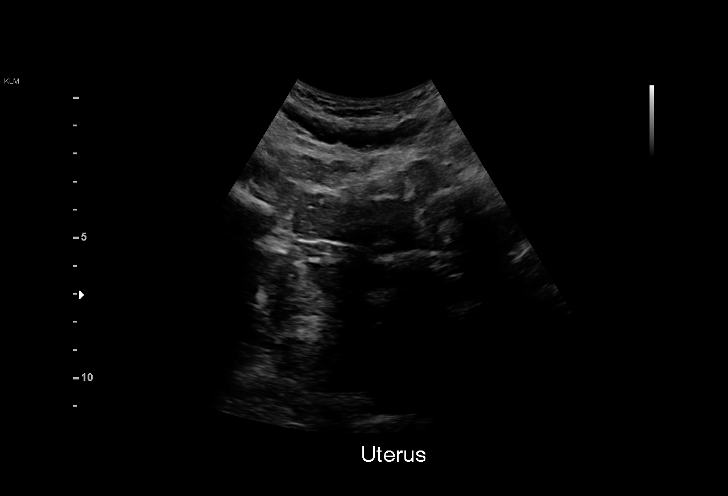
[im 12/32]
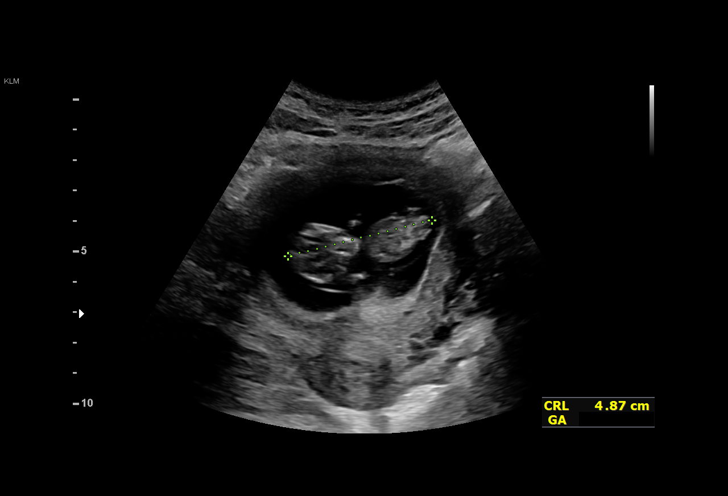
[im 14/32]
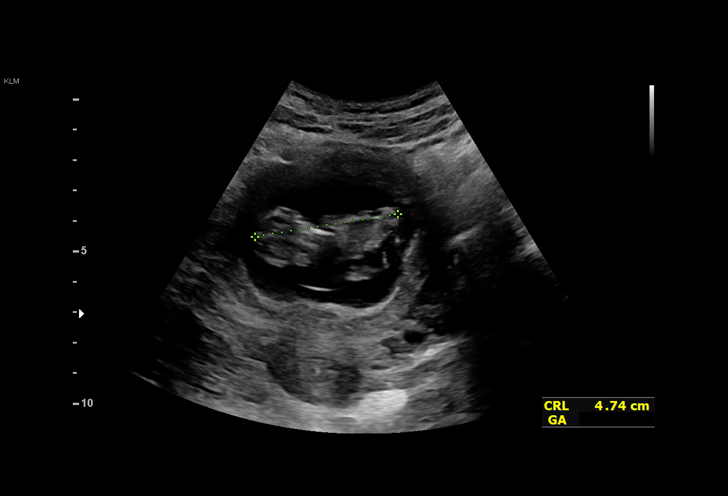
[im 17/32]
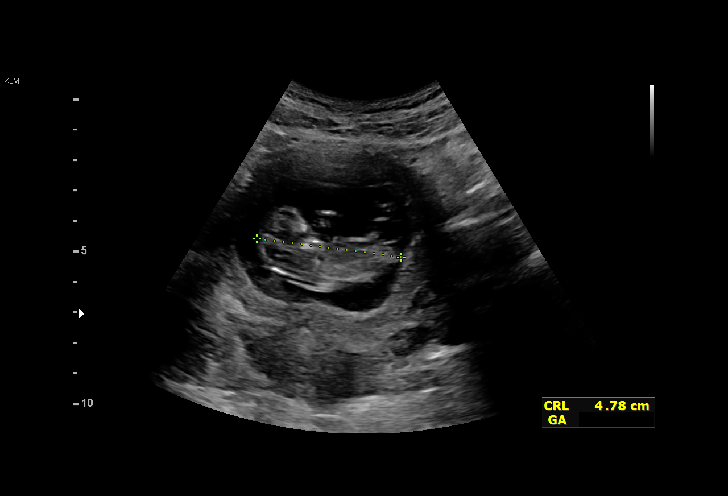
[im 18/32]
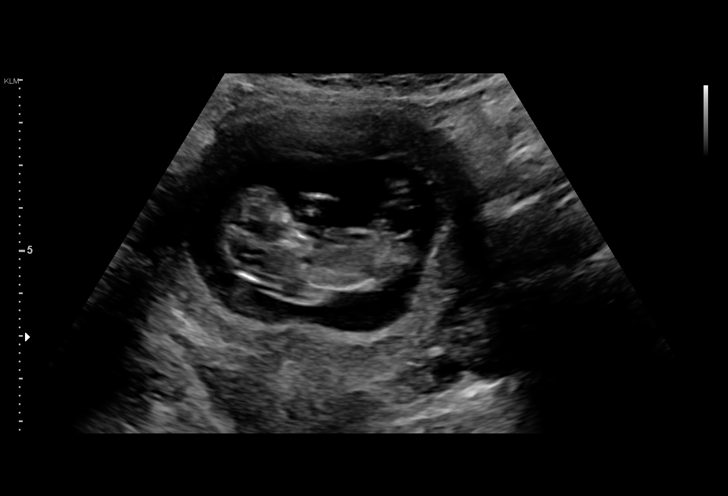
[im 20/32]
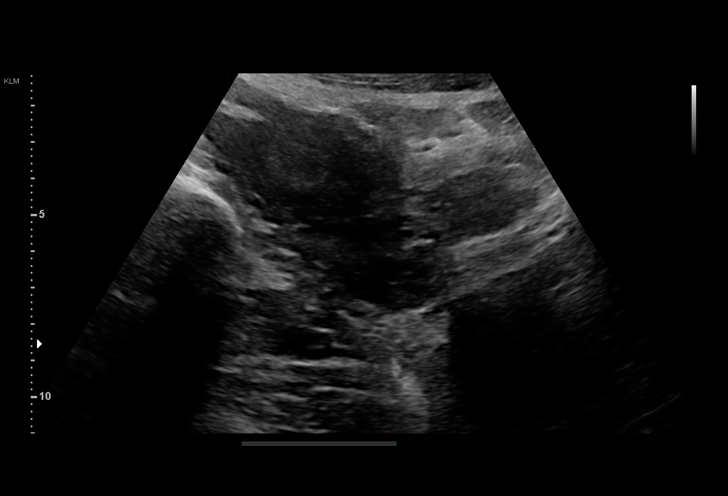
[im 22/32]
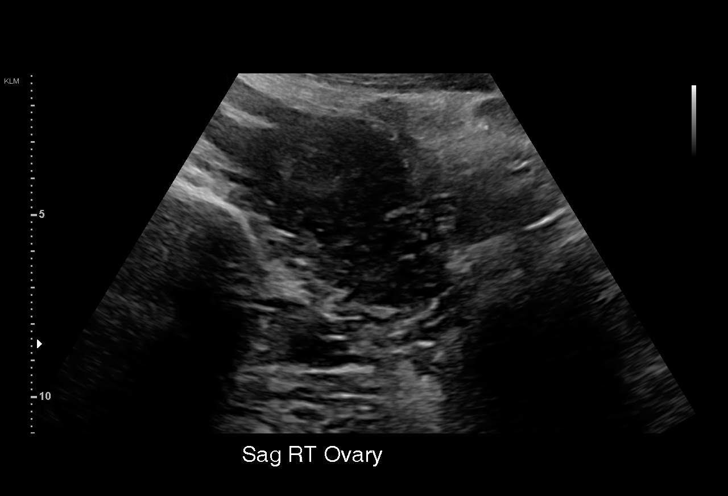
[im 25/32]
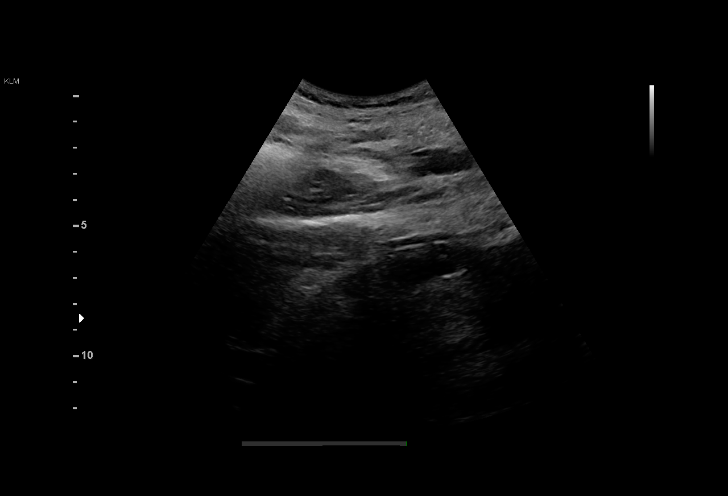
[im 27/32]
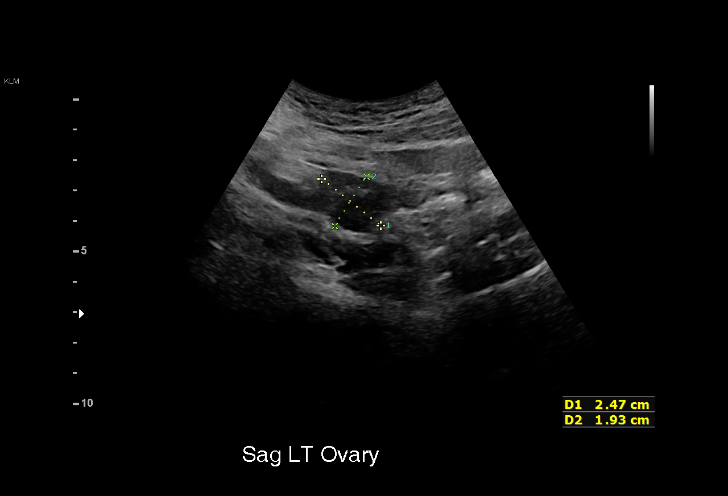
[im 29/32]
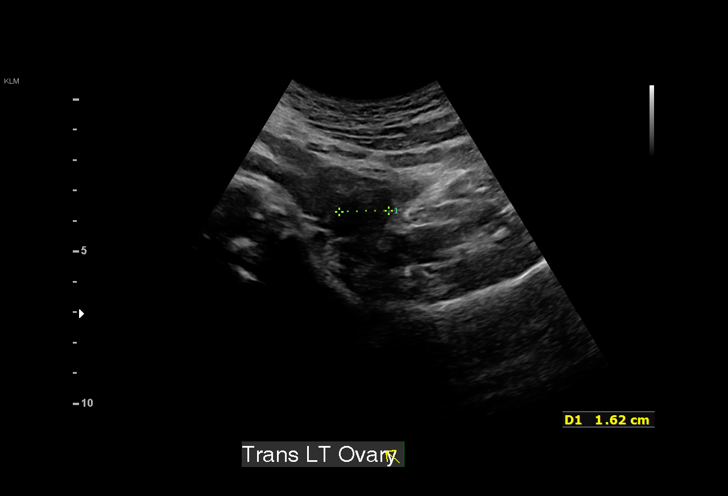
[im 32/32]
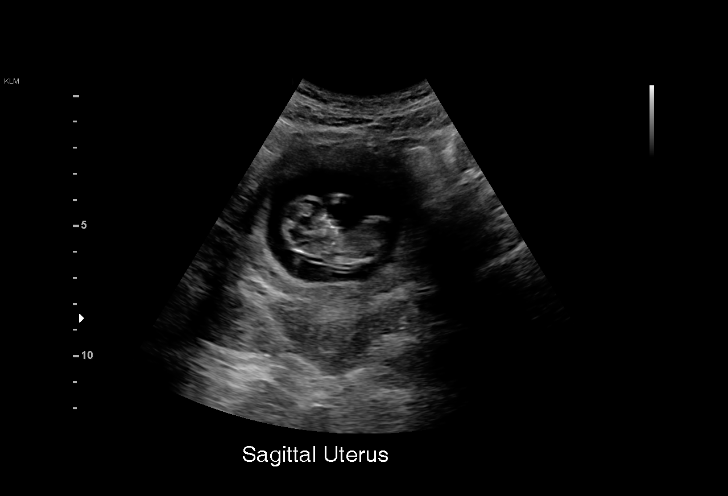

[15 of 28 positions shown; findings below may reference images not displayed]

FINDINGS: Intrauterine gestational sac: Present, single

Yolk sac:  Not identified

Embryo:  Present

Cardiac Activity: Present

Heart Rate: 160 bpm

CRL:   48.8 mm   11 w 4 d                  US EDC: 04/23/2021

Subchorionic hemorrhage:  None visualized.

Maternal uterus/adnexae:

Maternal uterus otherwise normal.

RIGHT ovary normal size and morphology 1.8 x 3.6 x 1.7 cm.

LEFT ovary normal size and morphology, 2.5 x 1.9 x 1.6 cm.

No free pelvic fluid or adnexal masses.
IMPRESSION: Single live intrauterine gestation at 11 weeks 4 days EGA by
crown-rump length.

No acute abnormalities.
